# Patient Record
Sex: Male | Born: 1975 | Race: White | Hispanic: No | Marital: Married | Smoking: Never smoker
Health system: Southern US, Community
[De-identification: ages and names within clinical notes are randomized; demographics above are authoritative.]

## PROBLEM LIST (undated history)

## (undated) DIAGNOSIS — J302 Other seasonal allergic rhinitis: Secondary | ICD-10-CM

## (undated) DIAGNOSIS — G4730 Sleep apnea, unspecified: Secondary | ICD-10-CM

## (undated) DIAGNOSIS — E7800 Pure hypercholesterolemia, unspecified: Secondary | ICD-10-CM

## (undated) HISTORY — DX: Other seasonal allergic rhinitis: J30.2

## (undated) HISTORY — DX: Sleep apnea, unspecified: G47.30

---

## 2014-10-27 ENCOUNTER — Emergency Department (HOSPITAL_COMMUNITY): Payer: 59

## 2014-10-27 ENCOUNTER — Emergency Department (HOSPITAL_COMMUNITY)
Admission: EM | Admit: 2014-10-27 | Discharge: 2014-10-27 | Disposition: A | Discharge status: Home / Self Care | Payer: 59 | Attending: Emergency Medicine | Admitting: Emergency Medicine

## 2014-10-27 ENCOUNTER — Encounter (HOSPITAL_COMMUNITY): Payer: Self-pay | Admitting: Emergency Medicine

## 2014-10-27 DIAGNOSIS — R110 Nausea: Secondary | ICD-10-CM | POA: Diagnosis not present

## 2014-10-27 DIAGNOSIS — Z7952 Long term (current) use of systemic steroids: Secondary | ICD-10-CM | POA: Diagnosis not present

## 2014-10-27 DIAGNOSIS — R0602 Shortness of breath: Secondary | ICD-10-CM | POA: Insufficient documentation

## 2014-10-27 DIAGNOSIS — E780 Pure hypercholesterolemia: Secondary | ICD-10-CM | POA: Insufficient documentation

## 2014-10-27 DIAGNOSIS — Z791 Long term (current) use of non-steroidal anti-inflammatories (NSAID): Secondary | ICD-10-CM | POA: Diagnosis not present

## 2014-10-27 DIAGNOSIS — R079 Chest pain, unspecified: Secondary | ICD-10-CM | POA: Diagnosis not present

## 2014-10-27 HISTORY — DX: Pure hypercholesterolemia, unspecified: E78.00

## 2014-10-27 LAB — CBC WITH DIFFERENTIAL/PLATELET
BASOS PCT: 1 % (ref 0–1)
Basophils Absolute: 0.1 10*3/uL (ref 0.0–0.1)
Eosinophils Absolute: 0.4 10*3/uL (ref 0.0–0.7)
Eosinophils Relative: 6 % — ABNORMAL HIGH (ref 0–5)
HCT: 48.5 % (ref 39.0–52.0)
Hemoglobin: 16.6 g/dL (ref 13.0–17.0)
LYMPHS ABS: 2.4 10*3/uL (ref 0.7–4.0)
Lymphocytes Relative: 33 % (ref 12–46)
MCH: 31.9 pg (ref 26.0–34.0)
MCHC: 34.2 g/dL (ref 30.0–36.0)
MCV: 93.1 fL (ref 78.0–100.0)
Monocytes Absolute: 0.5 10*3/uL (ref 0.1–1.0)
Monocytes Relative: 7 % (ref 3–12)
NEUTROS PCT: 53 % (ref 43–77)
Neutro Abs: 3.9 10*3/uL (ref 1.7–7.7)
PLATELETS: 333 10*3/uL (ref 150–400)
RBC: 5.21 MIL/uL (ref 4.22–5.81)
RDW: 12.9 % (ref 11.5–15.5)
WBC: 7.4 10*3/uL (ref 4.0–10.5)

## 2014-10-27 LAB — BASIC METABOLIC PANEL
Anion gap: 11 (ref 5–15)
BUN: 14 mg/dL (ref 6–23)
CO2: 24 mmol/L (ref 19–32)
CREATININE: 0.97 mg/dL (ref 0.50–1.35)
Calcium: 9.6 mg/dL (ref 8.4–10.5)
Chloride: 105 mmol/L (ref 96–112)
GFR calc Af Amer: >90 mL/min (ref >90)
GLUCOSE: 106 mg/dL — AB (ref 70–99)
POTASSIUM: 4.4 mmol/L (ref 3.5–5.1)
Sodium: 140 mmol/L (ref 135–145)

## 2014-10-27 LAB — I-STAT TROPONIN, ED
TROPONIN I, POC: 0 ng/mL (ref 0.00–0.08)
TROPONIN I, POC: 0 ng/mL (ref 0.00–0.08)
Troponin i, poc: 0.01 ng/mL (ref 0.00–0.08)

## 2014-10-27 LAB — D-DIMER, QUANTITATIVE (NOT AT ARMC): D DIMER QUANT: 4.3 ug{FEU}/mL — AB (ref 0.00–0.48)

## 2014-10-27 MED ORDER — KETOROLAC TROMETHAMINE 60 MG/2ML IM SOLN: 60.0000 mg | Freq: Once | INTRAMUSCULAR | Status: DC

## 2014-10-27 MED ORDER — KETOROLAC TROMETHAMINE 30 MG/ML IJ SOLN
30.0000 mg | Freq: Once | INTRAMUSCULAR | Status: AC
  Administered 2014-10-27: 30 mg via INTRAVENOUS
  Filled 2014-10-27: qty 1

## 2014-10-27 MED ORDER — IOHEXOL 350 MG/ML SOLN
80.0000 mL | Freq: Once | INTRAVENOUS | Status: AC | PRN
  Administered 2014-10-27: 80 mL via INTRAVENOUS

## 2014-10-27 MED ORDER — NAPROXEN 500 MG PO TABS: 500.0000 mg | ORAL_TABLET | Freq: Two times a day (BID) | ORAL | Status: DC

## 2014-10-27 MED ORDER — ASPIRIN 325 MG PO TABS
325.0000 mg | ORAL_TABLET | Freq: Once | ORAL | Status: AC
  Administered 2014-10-27: 325 mg via ORAL
  Filled 2014-10-27: qty 1

## 2014-10-27 NOTE — ED Notes (Signed)
I-stat drawn by phlebotomy.

## 2014-10-27 NOTE — ED Notes (Signed)
Pt is stable upon d/c and ambulates independently from ED with staff. Pt verbalizes understanding rt d/c instructions and medications.

## 2014-10-27 NOTE — ED Provider Notes (Signed)
CSN: [id number]     Arrival date & time 10/27/14  1059 History   First MD Initiated Contact with Patient 10/27/14 1213     Chief Complaint  Patient presents with  . Chest Pain     (Consider location/radiation/quality/duration/timing/severity/associated sxs/prior Treatment) Patient is a 39 y.o. male presenting with chest pain. The history is provided by the patient. No language interpreter was used.  Chest Pain Pain location:  Substernal area and R chest Pain quality: sharp   Pain radiates to:  Does not radiate Pain radiates to the back: no   Pain severity:  Severe Onset quality:  Sudden Duration:  3 hours Timing:  Constant Progression:  Improving Chronicity:  New Context: at rest   Relieved by:  Nothing Worsened by:  Nothing tried Ineffective treatments:  None tried Associated symptoms: diaphoresis, nausea and shortness of breath   Associated symptoms: no abdominal pain, no back pain, no cough, no dizziness, no dysphagia, no fatigue, no fever, no headache, no numbness, not vomiting and no weakness   Risk factors: male sex   Risk factors: no aortic disease, no birth control, no coronary artery disease, no diabetes mellitus, no Ehlers-Danlos syndrome, no high cholesterol, no hypertension, no immobilization, no Marfan's syndrome, not obese, not pregnant, no prior DVT/PE, no smoking and no surgery     Past Medical History  Diagnosis Date  . Hypercholesteremia    History reviewed. No pertinent past surgical history. History reviewed. No pertinent family history. History  Substance Use Topics  . Smoking status: Never Smoker   . Smokeless tobacco: Not on file  . Alcohol Use: Yes     Comment: occ    Review of Systems  Constitutional: Positive for diaphoresis. Negative for fever, activity change, appetite change and fatigue.  HENT: Negative for congestion, facial swelling, rhinorrhea and trouble swallowing.   Eyes: Negative for photophobia and pain.  Respiratory: Positive for  shortness of breath. Negative for cough and chest tightness.   Cardiovascular: Positive for chest pain. Negative for leg swelling.  Gastrointestinal: Positive for nausea. Negative for vomiting, abdominal pain, diarrhea and constipation.  Endocrine: Negative for polydipsia and polyuria.  Genitourinary: Negative for dysuria, urgency, decreased urine volume and difficulty urinating.  Musculoskeletal: Negative for back pain and gait problem.  Skin: Negative for color change, rash and wound.  Allergic/Immunologic: Negative for immunocompromised state.  Neurological: Negative for dizziness, facial asymmetry, speech difficulty, weakness, numbness and headaches.  Psychiatric/Behavioral: Negative for confusion, decreased concentration and agitation.      Allergies  Review of patient's allergies indicates no known allergies.  Home Medications   Prior to Admission medications   Medication Sig Start Date End Date Taking? Authorizing Provider  Ascorbic Acid (VITAMIN C PO) Take 1 tablet by mouth daily.   Yes Historical Provider, MD  atorvastatin (LIPITOR) 40 MG tablet Take 40 mg by mouth every evening.  10/14/14  Yes Historical Provider, MD  fluticasone (FLONASE) 50 MCG/ACT nasal spray Place 2 sprays into both nostrils daily as needed. For congestion 09/26/14  Yes Historical Provider, MD  naproxen (NAPROSYN) 500 MG tablet Take 1 tablet (500 mg total) by mouth 2 (two) times daily. 10/27/14   Toy Cookey, MD   BP 131/89 mmHg  Pulse 91  Temp(Src) 98.3 F (36.8 C) (Oral)  Resp 18  SpO2 96% Physical Exam  Constitutional: He is oriented to person, place, and time. He appears well-developed and well-nourished. No distress.  HENT:  Head: Normocephalic and atraumatic.  Mouth/Throat: No oropharyngeal exudate.  Eyes:  Pupils are equal, round, and reactive to light.  Neck: Normal range of motion. Neck supple.  Cardiovascular: Normal rate, regular rhythm and normal heart sounds.  Exam reveals no gallop  and no friction rub.   No murmur heard. Pulmonary/Chest: Effort normal and breath sounds normal. No respiratory distress. He has no wheezes. He has no rales.  Abdominal: Soft. Bowel sounds are normal. He exhibits no distension and no mass. There is no tenderness. There is no rebound and no guarding.  Musculoskeletal: Normal range of motion. He exhibits no edema or tenderness.  Neurological: He is alert and oriented to person, place, and time.  Skin: Skin is warm and dry.  Psychiatric: He has a normal mood and affect.    ED Course  Procedures (including critical care time) Labs Review Labs Reviewed  BASIC METABOLIC PANEL - Abnormal; Notable for the following:    Glucose, Bld 106 (*)    All other components within normal limits  CBC WITH DIFFERENTIAL/PLATELET - Abnormal; Notable for the following:    Eosinophils Relative 6 (*)    All other components within normal limits  D-DIMER, QUANTITATIVE - Abnormal; Notable for the following:    D-Dimer, Quant 4.30 (*)    All other components within normal limits  I-STAT TROPOININ, ED  Rosezena Sensor, ED  Rosezena Sensor, ED    Imaging Review Dg Chest 2 View  10/27/2014   CLINICAL DATA:  Chest pain.  EXAM: CHEST  2 VIEW  COMPARISON:  11/04/2007.  FINDINGS: Mediastinum hilar structures are normal. Lungs are clear. Heart size normal. No pleural effusion or pneumothorax.  IMPRESSION: No acute cardiopulmonary disease.   Electronically Signed   By: Maisie Fus  Register   On: 10/27/2014 13:02   Ct Angio Chest Pe W/cm &/or Wo Cm  10/27/2014   CLINICAL DATA:  Sudden onset sharp right sided CP worse with inspiration and cough  EXAM: CT ANGIOGRAPHY CHEST WITH CONTRAST  TECHNIQUE: Multidetector CT imaging of the chest was performed using the standard protocol during bolus administration of intravenous contrast. Multiplanar CT image reconstructions and MIPs were obtained to evaluate the vascular anatomy.  CONTRAST:  80mL OMNIPAQUE IOHEXOL 350 MG/ML SOLN   COMPARISON:  Current chest radiograph.  FINDINGS: There is no evidence of a pulmonary embolus.  Heart, great vessels, mediastinum and hila are unremarkable. No neck base or axillary masses or adenopathy.  Mild hazy and reticular opacity in the dependent lower lobes consistent with atelectasis. No evidence of pneumonia or edema. No pleural effusion or pneumothorax.  Limited evaluation of the upper abdomen is unremarkable.  No significant bony abnormality.  Review of the MIP images confirms the above findings.  IMPRESSION: 1. No evidence of a pulmonary embolus. 2. No acute findings. No findings to explain this patient's sudden onset right-sided chest pain. 3. Mild dependent subsegmental atelectasis in the posterior lower lobes. No evidence of pneumonia or pulmonary edema.   Electronically Signed   By: Amie Portland M.D.   On: 10/27/2014 15:40     EKG Interpretation   Date/Time:  Thursday October 27 2014 11:04:06 EST Ventricular Rate:  99 PR Interval:  154 QRS Duration: 86 QT Interval:  344 QTC Calculation: 441 R Axis:   64 Text Interpretation:  Normal sinus rhythm Cannot rule out Inferior infarct  , age undetermined Abnormal ECG S1, Q3, T3 Nonspecific T wave abnormality  No prior for comparison Confirmed by DOCHERTY  MD, MEGAN [phone]) on  10/27/2014 12:39:38 PM      MDM  Final diagnoses:  Chest pain    Pt is a 39 y.o. male with Pmhx as above who presents with sudden onset sharp R sided CP, worse with inspiration/cough, with assoc SOB, diaphoresis, nausea. Pain was 10/10, now 5/10 and is currently R lateral. On PE, VSS, pt in NAD. Lungs clear, no reproducible chest wall pain, skin changes.  EKG normal sinus rhythm with small inferior Q waves and nonspecific T wave abnormalities.  EKG does have S1 Q3, T3 appearance. NO hx or risk factors for PE/DVT, thought does report father had hx of blood clots.   D dimer elevated, but CTA nml. Repeat troponin ordered and was negative, reassuring given 6  hr time window from symptoms onset w/ constant pain. CP currently 1/10.  ACS, PE, dissection unlikely. Will d/c home with naproxen for pain, instructions for comm health & wellness f/u.    Memory [facility] evaluation in the Emergency Department is complete. It has been determined that no acute conditions requiring further emergency intervention are present at this time. The patient/guardian have been advised of the diagnosis and plan. We have discussed signs and symptoms that warrant return to the ED, such as changes or worsening in symptoms, worsneing pain, SOB, leg swelling, fever.       Toy CookeyMegan Docherty, MD 10/27/14 [phone]

## 2014-10-27 NOTE — ED Notes (Signed)
Pt states he was at work and began feeling like a band was squeezing around his chest. Pt became diaphoretic and had SOB. Pt states he sat down and that helped to alleviate some discomfort, but then he stood back up and the pain worsened. Pt states pain has now eased off, but it hurts when he breathes.

## 2014-10-27 NOTE — ED Notes (Signed)
Pt c/o right sided CP that was sharp in nature one hour ago worse with inspiration and cough; pt sts some improvement at present

## 2014-10-27 NOTE — Discharge Instructions (Signed)

## 2014-11-07 ENCOUNTER — Other Ambulatory Visit: Payer: Self-pay

## 2014-11-09 ENCOUNTER — Ambulatory Visit (INDEPENDENT_AMBULATORY_CARE_PROVIDER_SITE_OTHER): Payer: 59 | Admitting: Internal Medicine

## 2014-11-09 ENCOUNTER — Encounter: Payer: Self-pay | Admitting: Internal Medicine

## 2014-11-09 ENCOUNTER — Other Ambulatory Visit: Payer: Self-pay

## 2014-11-09 VITALS — BP 128/76 | HR 85 | Ht 79.0 in | Wt 268.0 lb

## 2014-11-09 DIAGNOSIS — R072 Precordial pain: Secondary | ICD-10-CM

## 2014-11-09 DIAGNOSIS — R079 Chest pain, unspecified: Secondary | ICD-10-CM

## 2014-11-09 DIAGNOSIS — E785 Hyperlipidemia, unspecified: Secondary | ICD-10-CM

## 2014-11-09 NOTE — Patient Instructions (Signed)
Your physician recommends that you schedule a follow-up appointment as needed  Your physician has requested that you have an echocardiogram. Echocardiography is a painless test that uses sound waves to create images of your heart. It provides your doctor with information about the size and shape of your heart and how well your heart's chambers and valves are working. This procedure takes approximately one hour. There are no restrictions for this procedure.   Your physician has requested that you have an exercise tolerance test. For further information please visit [email or web address]. Please also follow instruction sheet, as given.    

## 2014-11-09 NOTE — Progress Notes (Signed)
Cardiology Office Note   Date:  11/09/2014   ID:  Magic Mohler, DOB 1975/11/07, MRN [id number]  PCP:   Dr Cyndia Bent  Cardiologist:   Hillis Range, MD    Chief Complaint  Patient presents with  . Appointment    CP     History of Present Illness: Johnathan Thompson is a 39 y.o. male who presents today for cardiology evaluation.  He reports being in good health until 2/11 when he developed abrupt onset of 10/10 chest pain.  His pain occurred at rest and while standing.  He describes this as a squeezing sensation (not sharp) but worse with deep inspiration.  He had to sit down because of his discomfort.  He presented to [facility] where his pain gradually resolved.  CMs were negative and ekg revealed inferior TWI.  He had a positive Ddimer and therefore underwent chest CT which did not reveal PTE.  I have personally reviewed his CXR and CT films today. He has done well since that time, without any further chest discomfort.  Today, he denies symptoms of palpitations,  shortness of breath, orthopnea, PND, lower extremity edema, claudication, dizziness, presyncope, syncope, bleeding, or neurologic sequela. The patient is tolerating medications without difficulties and is otherwise without complaint today.    Past Medical History  Diagnosis Date  . Hypercholesteremia   . Seasonal allergies    No past surgical history on file.   Current Outpatient Prescriptions  Medication Sig Dispense Refill  . Ascorbic Acid (VITAMIN C PO) Take 1 tablet by mouth daily.    Marland Kitchen atorvastatin (LIPITOR) 40 MG tablet Take 40 mg by mouth every evening.   0  . fluticasone (FLONASE) 50 MCG/ACT nasal spray Place 2 sprays into both nostrils daily as needed. For congestion  2   No current facility-administered medications for this visit.    Allergies:   Review of patient's allergies indicates no known allergies.   Social History:  The patient  reports that he has never smoked. He does not have any smokeless tobacco  history on file. He reports that he drinks alcohol. He reports that he does not use illicit drugs.   Family History:  The patient's  family history includes CAD in his father; Diabetes in his father; Lung cancer in his mother; Peripheral vascular disease in his father.  He does not have siblings.    ROS:  Please see the history of present illness.   All other systems are reviewed and negative.    PHYSICAL EXAM: VS:  BP 128/76 mmHg  Pulse 85  Ht  (2.007 m)  Wt 268 lb (121.564 kg)  BMI 30.18 kg/m2 , BMI Body mass index is 30.18 kg/(m^2). GEN: Well nourished, well developed, in no acute distress HEENT: normal Neck: no JVD, carotid bruits, or masses Cardiac: RRR; no murmurs, rubs, or gallops,no edema  Respiratory:  clear to auscultation bilaterally, normal work of breathing GI: soft, nontender, nondistended, + BS MS: no deformity or atrophy Skin: warm and dry  Neuro:  Strength and sensation are intact Psych: euthymic mood, full affect  Recent Labs: 10/27/2014: BUN 14; Creatinine 0.97; Hemoglobin 16.6; Platelets 333; Potassium 4.4; Sodium 140    Lipid Panel  No results found for: CHOL, TRIG, HDL, CHOLHDL, VLDL, LDLCALC, LDLDIRECT   Wt Readings from Last 3 Encounters:  11/09/14 268 lb (121.564 kg)      Other studies Reviewed: Additional studies/ records that were reviewed today include: ED MD notes    ASSESSMENT AND PLAN:  1.  Chest pain Likely atypical however given severity, further CV workup is warranted. I will start on asa 81mg  daily today Proceed with echo and GXT for further CV risk stratification.  If these are normal, no further workup is planned  2. HL He says lipids have been recently checked and were "normal" I have asked that he obtain values and forward to my office    Labs/ tests ordered today include:  Orders Placed This Encounter  Procedures  . Exercise tolerance test  . 2D Echocardiogram without contrast    Follow-up:  If above testing is  normal, I will see as needed.  If testing is abnormal then we will proceed with additional risk stratification.  Randolm IdolSigned, Berdell Hostetler, MD  11/09/2014 10:49 PM     [facility] [address] [place] [address] [phone] (office) [phone] (fax)

## 2014-11-14 ENCOUNTER — Encounter: Payer: 59 | Admitting: Cardiovascular Disease

## 2014-11-29 ENCOUNTER — Telehealth (HOSPITAL_COMMUNITY): Payer: Self-pay

## 2014-11-29 NOTE — Telephone Encounter (Signed)
Encounter complete. 

## 2014-12-01 ENCOUNTER — Ambulatory Visit (HOSPITAL_COMMUNITY)
Admission: RE | Admit: 2014-12-01 | Discharge: 2014-12-01 | Disposition: A | Discharge status: Home / Self Care | Payer: 59 | Source: Ambulatory Visit | Attending: Internal Medicine | Admitting: Internal Medicine

## 2014-12-01 DIAGNOSIS — R079 Chest pain, unspecified: Secondary | ICD-10-CM | POA: Diagnosis not present

## 2014-12-01 DIAGNOSIS — E785 Hyperlipidemia, unspecified: Secondary | ICD-10-CM | POA: Insufficient documentation

## 2014-12-01 NOTE — Procedures (Signed)
Exercise Treadmill Test  Pre-Exercise Testing Evaluation Rhythm: normal sinus  Rate: 84    Test  Exercise Tolerance Test Ordering MD: Hillis RangeJames Allred, MD  Interpreting MD:   Unique Test No:1 Treadmill:  1  Indication for ETT: chest pain - rule out ischemia  Contraindication to ETT: No   Stress Modality: exercise - treadmill  Cardiac Imaging Performed: non   Protocol: standard Bruce - maximal  Max BP:  150/78  Max MPHR (bpm):  180 85% MPR (bpm):  153  MPHR obtained (bpm):  193 % MPHR obtained:  107  Reached 85% MPHR (min:sec):  9:40 Total Exercise Time (min-sec):  14:00  Workload in METS:  17.20 Borg Scale: 14  Reason ETT Terminated:  fatigue    ST Segment Analysis At Rest: normal ST segments - no evidence of significant ST depression With Exercise: no evidence of significant ST depression  Other Information Arrhythmia:  No Angina during ETT:  absent (0) Quality of ETT:  diagnostic  ETT Interpretation:  normal - no evidence of ischemia by ST analysis  Comments:  Excellent exercise tolerance with no CP symptoms  No ECG evidence of ischemia or arrhyhtmia  Duke TM Score 14 - LOW RISK  Recommendations: Consider non-cardiac etiology for CP.   Marykay LexHARDING, Kevante Lunt W, M.D., M.S. Interventional Cardiologist   Pager # [phone]

## 2014-12-01 NOTE — Progress Notes (Signed)
2D Echo Performed 12/01/2014    Lindzie Boxx, RCS  

## 2019-05-21 ENCOUNTER — Other Ambulatory Visit: Payer: Self-pay | Admitting: Orthopedic Surgery

## 2019-05-21 DIAGNOSIS — M542 Cervicalgia: Secondary | ICD-10-CM

## 2019-05-31 ENCOUNTER — Other Ambulatory Visit: Payer: Self-pay

## 2019-05-31 ENCOUNTER — Ambulatory Visit
Admission: RE | Admit: 2019-05-31 | Discharge: 2019-05-31 | Disposition: A | Discharge status: Home / Self Care | Payer: 59 | Source: Ambulatory Visit | Attending: Orthopedic Surgery | Admitting: Orthopedic Surgery

## 2019-05-31 DIAGNOSIS — M542 Cervicalgia: Secondary | ICD-10-CM

## 2019-05-31 MED ORDER — TRIAMCINOLONE ACETONIDE 40 MG/ML IJ SUSP (RADIOLOGY)
60.0000 mg | Freq: Once | INTRAMUSCULAR | Status: AC
  Administered 2019-05-31: 60 mg via EPIDURAL

## 2019-05-31 MED ORDER — IOPAMIDOL (ISOVUE-M 300) INJECTION 61%
1.0000 mL | Freq: Once | INTRAMUSCULAR | Status: AC
  Administered 2019-05-31: 12:00:00 1 mL via EPIDURAL

## 2019-05-31 NOTE — Discharge Instructions (Signed)

## 2019-09-17 HISTORY — PX: CERVICAL SPINE SURGERY: SHX589

## 2020-06-30 IMAGING — XA DG INJECT/[PERSON_NAME] INC NEEDLE/CATH/PLC EPI/CERV/THOR W/IMG
2 series · 2 of 2 positions shown · non-contrast
Comparison: none

CLINICAL DATA: Spondylosis without myelopathy. Right shoulder and
arm pain.

[Series 1: ortho standard · 1 of 1 slices shown (1 of 2)]
[im 1/1]
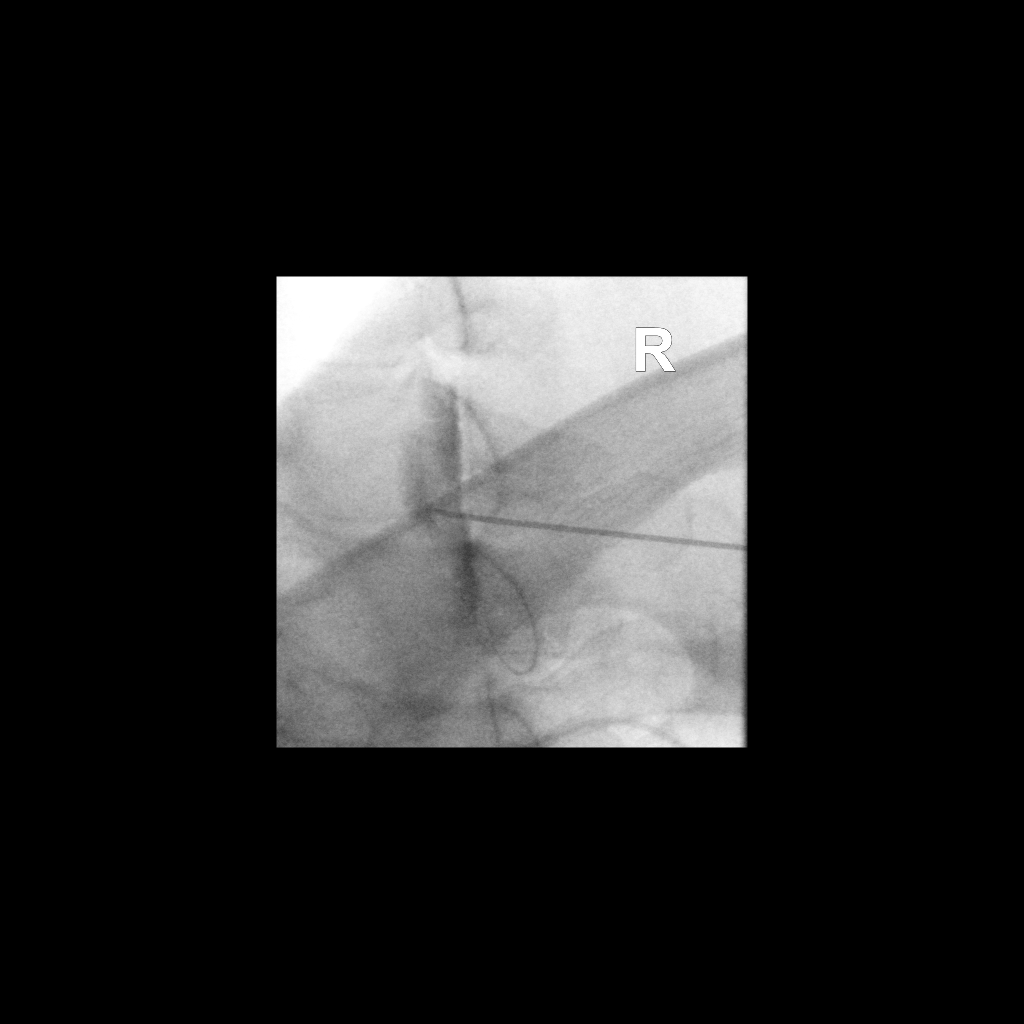

[Series 2: ortho standard · 1 of 1 slices shown (2 of 2)]
[im 1/1]
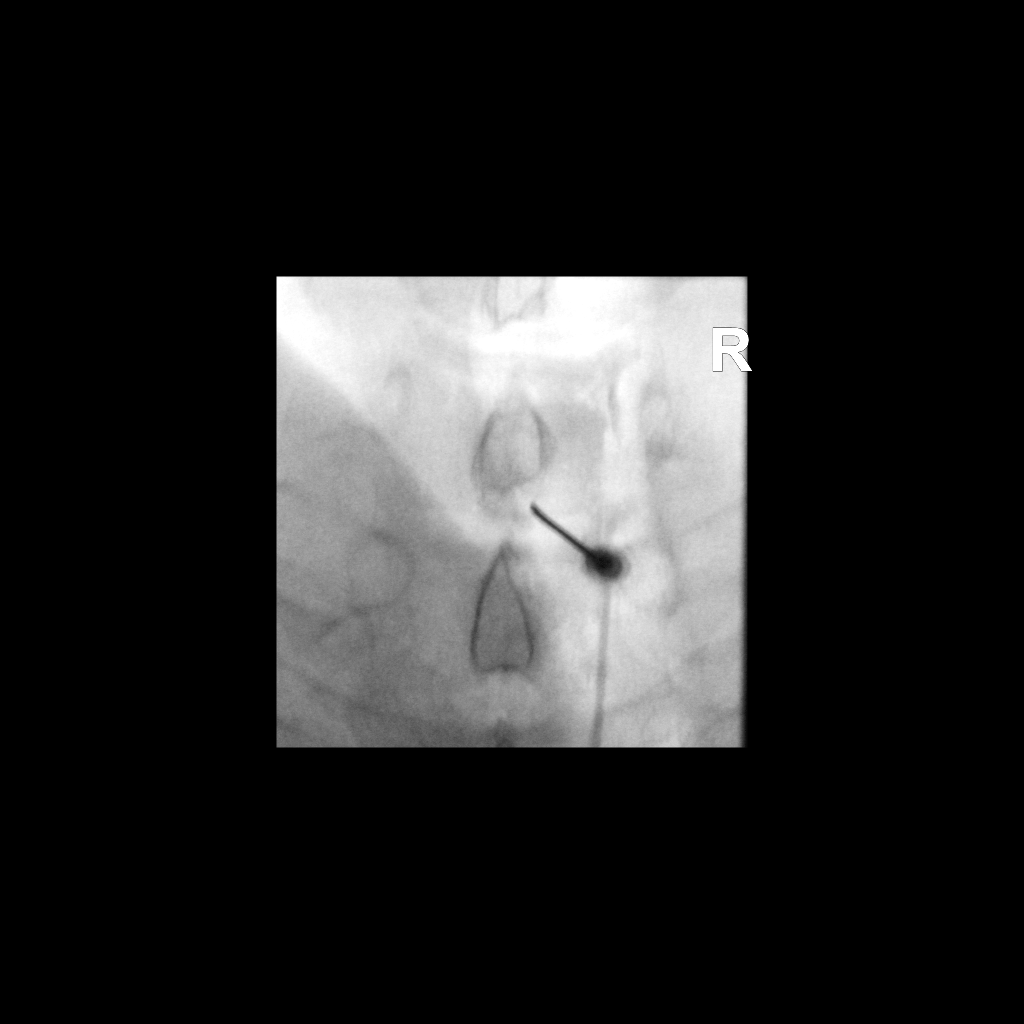

[2 of 2 positions shown; findings below may reference images not displayed]

FLUOROSCOPY TIME:  0 minutes 42 seconds. 21.82 micro gray meter
squared

PROCEDURE:
CERVICAL EPIDURAL INJECTION

An interlaminar approach was performed on the right at C7-T1. A 20
gauge epidural needle was advanced using loss-of-resistance
technique.

DIAGNOSTIC EPIDURAL INJECTION

Injection of Isovue-M 300 shows a good epidural pattern with spread
above and below the level of needle placement, primarily on the
right. No vascular opacification is seen. THERAPEUTIC

EPIDURAL INJECTION

1.5 ml of Kenalog 40 mixed with 1 ml of 1% Lidocaine and 2 ml of
normal saline were then instilled. The procedure was well-tolerated,
and the patient was discharged thirty minutes following the
injection in good condition.
IMPRESSION: Technically successful initial epidural injection on the right at
C7-T1.

## 2020-09-16 HISTORY — PX: ROTATOR CUFF REPAIR: SHX139

## 2023-12-03 ENCOUNTER — Encounter: Payer: Self-pay | Admitting: Medical

## 2023-12-03 ENCOUNTER — Ambulatory Visit: Payer: 59 | Admitting: Medical

## 2023-12-03 VITALS — BP 116/76 | HR 90 | Resp 18 | Ht 79.0 in | Wt 284.6 lb

## 2023-12-03 DIAGNOSIS — Z1211 Encounter for screening for malignant neoplasm of colon: Secondary | ICD-10-CM

## 2023-12-03 DIAGNOSIS — Z125 Encounter for screening for malignant neoplasm of prostate: Secondary | ICD-10-CM | POA: Diagnosis not present

## 2023-12-03 DIAGNOSIS — E785 Hyperlipidemia, unspecified: Secondary | ICD-10-CM | POA: Diagnosis not present

## 2023-12-03 DIAGNOSIS — J309 Allergic rhinitis, unspecified: Secondary | ICD-10-CM

## 2023-12-03 DIAGNOSIS — Z0000 Encounter for general adult medical examination without abnormal findings: Secondary | ICD-10-CM

## 2023-12-03 DIAGNOSIS — G4730 Sleep apnea, unspecified: Secondary | ICD-10-CM

## 2023-12-03 LAB — CBC WITH DIFFERENTIAL/PLATELET
Basophils Absolute: 0.1 10*3/uL (ref 0.0–0.1)
Basophils Relative: 1.7 % (ref 0.0–3.0)
Eosinophils Absolute: 0.5 10*3/uL (ref 0.0–0.7)
Eosinophils Relative: 7.7 % — ABNORMAL HIGH (ref 0.0–5.0)
HCT: 48.6 % (ref 39.0–52.0)
Hemoglobin: 16.2 g/dL (ref 13.0–17.0)
Lymphocytes Relative: 31.2 % (ref 12.0–46.0)
Lymphs Abs: 1.9 10*3/uL (ref 0.7–4.0)
MCHC: 33.4 g/dL (ref 30.0–36.0)
MCV: 97 fl (ref 78.0–100.0)
Monocytes Absolute: 0.6 10*3/uL (ref 0.1–1.0)
Monocytes Relative: 9.8 % (ref 3.0–12.0)
Neutro Abs: 3 10*3/uL (ref 1.4–7.7)
Neutrophils Relative %: 49.6 % (ref 43.0–77.0)
Platelets: 286 10*3/uL (ref 150.0–400.0)
RBC: 5.01 Mil/uL (ref 4.22–5.81)
RDW: 13.5 % (ref 11.5–15.5)
WBC: 6.1 10*3/uL (ref 4.0–10.5)

## 2023-12-03 LAB — LIPID PANEL
Cholesterol: 174 mg/dL (ref 0–200)
HDL: 42.6 mg/dL (ref >39.00)
LDL Cholesterol: 107 mg/dL — ABNORMAL HIGH (ref 0–99)
NonHDL: 131.13
Total CHOL/HDL Ratio: 4
Triglycerides: 122 mg/dL (ref 0.0–149.0)
VLDL: 24.4 mg/dL (ref 0.0–40.0)

## 2023-12-03 LAB — COMPREHENSIVE METABOLIC PANEL
ALT: 54 U/L — ABNORMAL HIGH (ref 0–53)
AST: 36 U/L (ref 0–37)
Albumin: 4.9 g/dL (ref 3.5–5.2)
Alkaline Phosphatase: 93 U/L (ref 39–117)
BUN: 22 mg/dL (ref 6–23)
CO2: 28 meq/L (ref 19–32)
Calcium: 10 mg/dL (ref 8.4–10.5)
Chloride: 107 meq/L (ref 96–112)
Creatinine, Ser: 0.93 mg/dL (ref 0.40–1.50)
GFR: 96.18 mL/min (ref >60.00)
Glucose, Bld: 105 mg/dL — ABNORMAL HIGH (ref 70–99)
Potassium: 4.3 meq/L (ref 3.5–5.1)
Sodium: 142 meq/L (ref 135–145)
Total Bilirubin: 0.7 mg/dL (ref 0.2–1.2)
Total Protein: 7.4 g/dL (ref 6.0–8.3)

## 2023-12-03 LAB — PSA: PSA: 0.8 ng/mL (ref 0.10–4.00)

## 2023-12-03 NOTE — Progress Notes (Signed)
 Subjective:    Patient ID: Johnathan Thompson, male    DOB: 01-04-1976, 48 y.o.   MRN: [id number]  HPI  Pt in for first time.  Pt was with [facility]. 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 No regular exercise. State moderate healthy diet. Fruits, vegetables and avoid fried foods. Red meat 2-3 times a week. Nonsmoker. Alcohol- 2-4 days. Wine 2 glasses when he does drink. Sometimes beer.   On review pt never had colonoscopy.  Last tetanus date? Pt can't remember.  Will wait to get records to review before giving.   High cholesterol- on 40 mg daily. On for 25 years.Fh high cholesterol but no MI or strokes in family.  Sleep apnea- on cpap for 3 months.   Review of Systems  Constitutional:  Negative for chills, fatigue and fever.  HENT:  Negative for congestion, drooling and ear discharge.   Respiratory:  Negative for cough, chest tightness and wheezing.   Cardiovascular:  Negative for chest pain and palpitations.  Gastrointestinal:  Negative for abdominal pain, blood in stool and diarrhea.  Genitourinary:  Negative for dysuria and frequency.  Musculoskeletal:  Negative for back pain, joint swelling, neck pain and neck stiffness.  Skin:  Negative for pallor and rash.  Neurological:  Negative for seizures and facial asymmetry.  Hematological:  Negative for adenopathy. Does not bruise/bleed easily.  Psychiatric/Behavioral:  Negative for behavioral problems and dysphoric mood. The patient is not nervous/anxious.     Past Medical History:  Diagnosis Date   Hypercholesteremia    Seasonal allergies      Social History   Socioeconomic History   Marital status: Married    Spouse name: Not on file   Number of children: Not on file   Years of education: Not on file   Highest education level: Not on file  Occupational History   Not on file  Tobacco Use   Smoking status: Never   Smokeless tobacco: Not on file  Substance and Sexual Activity   Alcohol use: Yes     Comment: occ   Drug use: No   Sexual activity: Not on file  Other Topics Concern   Not on file  Social History Narrative   Pt lives in [place] with family.  Works for [employer] as [employer].   Social Drivers of Corporate investment banker Strain: Low Risk  (08/17/2023)   Received from [facility]   Overall Financial Resource Strain (CARDIA)    Difficulty of Paying Living Expenses: Not hard at all  Food Insecurity: No Food Insecurity (08/17/2023)   [facility]   Hunger Vital Sign    Worried About Running Out of Food in the Last Year: Never true    Ran Out of Food in the Last Year: Never true  Transportation Needs: No Transportation Needs (08/17/2023)   [facility] - Transportation    Lack of Transportation (Medical): No    Lack of Transportation (Non-Medical): No  Physical Activity: Sufficiently Active (08/17/2023)   Received from [facility]   Exercise Vital Sign    Days of Exercise per Week: 2 days    Minutes of Exercise per Session: 120 min  Stress: No Stress Concern Present (08/17/2023)   Received from [facility] - Occupational Stress Questionnaire    Feeling of Stress : Only a little  Social Connections: Socially Integrated (08/17/2023)   Received from [facility]  Social Network    How would you rate your social network (family, work, friends)?: Good participation with social networks  Intimate Partner Violence: Not At Risk (08/17/2023)   Received from [facility]   HITS    Over the last 12 months how often did your partner physically hurt you?: Never    Over the last 12 months how often did your partner insult you or talk down to you?: Never    Over the last 12 months how often did your partner threaten you with physical harm?: Never    Over the last 12 months how often did your partner scream or curse at you?: Never    No past surgical history on file.  Family History   Problem Relation Age of Onset   Lung cancer Mother        died at age 35 of lung ca (smoker)   CAD Father        prior stent   Peripheral vascular disease Father    Diabetes Father     No Known Allergies  Current Outpatient Medications on File Prior to Visit  Medication Sig Dispense Refill   atorvastatin (LIPITOR) 40 MG tablet Take 40 mg by mouth every evening.   0   fluticasone (FLONASE) 50 MCG/ACT nasal spray Place 2 sprays into both nostrils daily as needed. For congestion  2   No current facility-administered medications on file prior to visit.    BP 116/76   Pulse 90   Resp 18   Ht 6\' 7"  (2.007 m)   Wt 284 lb 9.6 oz (129.1 kg)   SpO2 94%   BMI 32.06 kg/m   95% on on recheck.     Objective:   Physical Exam  General Mental Status- Alert. General Appearance- Not in acute distress.   Skin No worrisome moles or lesions(inspection anterior and posterior thorax)  Neck  No JVD.  Chest and Lung Exam Auscultation: Breath Sounds:-Normal.  Cardiovascular Auscultation:Rythm- Regular. Murmurs & Other Heart Sounds:Auscultation of the heart reveals- No Murmurs.  Abdomen Inspection:-Inspeection Normal. Palpation/Percussion:Note:No mass. Palpation and Percussion of the abdomen reveal- Non Tender, Non Distended + BS, no rebound or guarding.   Neurologic Cranial Nerve exam:- CN III-XII intact(No nystagmus), symmetric smile. Strength:- 5/5 equal and symmetric strength both upper and lower extremities.       Assessment & Plan:   Patient Instructions  For you wellness exam today I have ordered cbc, cmp, psa and lipid panel.  Unclear if you need tdap. Need to review records or call prior office for date.  Recommend exercise and healthy diet.  We will let you know lab results as they come in.  Follow up date appointment will be determined after lab review.    Follow lipid panel and see if need med adjustment.  Continue cpap.     Esperanza Richters, PA-C

## 2023-12-03 NOTE — Patient Instructions (Addendum)
 For you wellness exam today I have ordered cbc, cmp, psa and lipid panel.  Unclear if you need tdap. Need to review records or call prior office for date.  Recommend exercise and healthy diet.  We will let you know lab results as they come in.  Follow up date appointment will be determined after lab review.    Follow lipid panel and see if need med adjustment.  Continue cpap for sleep apnea.  Keep up with skin cancer screening thru derm as they recommend. Today no worrisome moles or lesions.  Preventive Care 16-64 Years Old, Male Preventive care refers to lifestyle choices and visits with your health care provider that can promote health and wellness. Preventive care visits are also called wellness exams. What can I expect for my preventive care visit? Counseling During your preventive care visit, your health care provider may ask about your: Medical history, including: Past medical problems. Family medical history. Current health, including: Emotional well-being. Home life and relationship well-being. Sexual activity. Lifestyle, including: Alcohol, nicotine or tobacco, and drug use. Access to firearms. Diet, exercise, and sleep habits. Safety issues such as seatbelt and bike helmet use. Sunscreen use. Work and work Astronomer. Physical exam Your health care provider will check your: Height and weight. These may be used to calculate your BMI (body mass index). BMI is a measurement that tells if you are at a healthy weight. Waist circumference. This measures the distance around your waistline. This measurement also tells if you are at a healthy weight and may help predict your risk of certain diseases, such as type 2 diabetes and high blood pressure. Heart rate and blood pressure. Body temperature. Skin for abnormal spots. What immunizations do I need?  Vaccines are usually given at various ages, according to a schedule. Your health care provider will recommend vaccines  for you based on your age, medical history, and lifestyle or other factors, such as travel or where you work. What tests do I need? Screening Your health care provider may recommend screening tests for certain conditions. This may include: Lipid and cholesterol levels. Diabetes screening. This is done by checking your blood sugar (glucose) after you have not eaten for a while (fasting). Hepatitis B test. Hepatitis C test. HIV (human immunodeficiency virus) test. STI (sexually transmitted infection) testing, if you are at risk. Lung cancer screening. Prostate cancer screening. Colorectal cancer screening. Talk with your health care provider about your test results, treatment options, and if necessary, the need for more tests. Follow these instructions at home: Eating and drinking  Eat a diet that includes fresh fruits and vegetables, whole grains, lean protein, and low-fat dairy products. Take vitamin and mineral supplements as recommended by your health care provider. Do not drink alcohol if your health care provider tells you not to drink. If you drink alcohol: Limit how much you have to 0-2 drinks a day. Know how much alcohol is in your drink. In the [place] one drink equals one 12 oz bottle of beer (355 mL), one 5 oz glass of wine (148 mL), or one 1 oz glass of hard liquor (44 mL). Lifestyle Brush your teeth every morning and night with fluoride toothpaste. Floss one time each day. Exercise for at least 30 minutes 5 or more days each week. Do not use any products that contain nicotine or tobacco. These products include cigarettes, chewing tobacco, and vaping devices, such as e-cigarettes. If you need help quitting, ask your health care provider. Do not use drugs. If  you are sexually active, practice safe sex. Use a condom or other form of protection to prevent STIs. Take aspirin only as told by your health care provider. Make sure that you understand how much to take and what form to  take. Work with your health care provider to find out whether it is safe and beneficial for you to take aspirin daily. Find healthy ways to manage stress, such as: Meditation, yoga, or listening to music. Journaling. Talking to a trusted person. Spending time with friends and family. Minimize exposure to UV radiation to reduce your risk of skin cancer. Safety Always wear your seat belt while driving or riding in a vehicle. Do not drive: If you have been drinking alcohol. Do not ride with someone who has been drinking. When you are tired or distracted. While texting. If you have been using any mind-altering substances or drugs. Wear a helmet and other protective equipment during sports activities. If you have firearms in your house, make sure you follow all gun safety procedures. What's next? Go to your health care provider once a year for an annual wellness visit. Ask your health care provider how often you should have your eyes and teeth checked. Stay up to date on all vaccines. This information is not intended to replace advice given to you by your health care provider. Make sure you discuss any questions you have with your health care provider. Document Revised: 02/28/2021 Document Reviewed: 02/28/2021 Elsevier Patient Education  2024 [employer].

## 2023-12-04 ENCOUNTER — Ambulatory Visit

## 2023-12-04 ENCOUNTER — Encounter: Payer: Self-pay | Admitting: Medical

## 2023-12-04 ENCOUNTER — Other Ambulatory Visit: Payer: Self-pay | Admitting: *Deleted

## 2023-12-04 DIAGNOSIS — R739 Hyperglycemia, unspecified: Secondary | ICD-10-CM

## 2023-12-04 LAB — HEMOGLOBIN A1C: Hgb A1c MFr Bld: 5.7 % (ref 4.6–6.5)

## 2023-12-25 ENCOUNTER — Encounter: Payer: Self-pay | Admitting: Pediatrics

## 2023-12-31 ENCOUNTER — Telehealth: Admitting: Physician Assistant

## 2023-12-31 DIAGNOSIS — J019 Acute sinusitis, unspecified: Secondary | ICD-10-CM

## 2023-12-31 DIAGNOSIS — B9689 Other specified bacterial agents as the cause of diseases classified elsewhere: Secondary | ICD-10-CM

## 2023-12-31 MED ORDER — AMOXICILLIN-POT CLAVULANATE 875-125 MG PO TABS: 1.0000 | ORAL_TABLET | Freq: Two times a day (BID) | ORAL | 0 refills | Status: DC

## 2023-12-31 NOTE — Progress Notes (Signed)
 Virtual Visit Consent   Johnathan Thompson, you are scheduled for a virtual visit with a [facility] provider today. Just as with appointments in the office, your consent must be obtained to participate. Your consent will be active for this visit and any virtual visit you may have with one of our providers in the next 365 days. If you have a MyChart account, a copy of this consent can be sent to you electronically.  As this is a virtual visit, video technology does not allow for your provider to perform a traditional examination. This may limit your provider's ability to fully assess your condition. If your provider identifies any concerns that need to be evaluated in person or the need to arrange testing (such as labs, EKG, etc.), we will make arrangements to do so. Although advances in technology are sophisticated, we cannot ensure that it will always work on either your end or our end. If the connection with a video visit is poor, the visit may have to be switched to a telephone visit. With either a video or telephone visit, we are not always able to ensure that we have a secure connection.  By engaging in this virtual visit, you consent to the provision of healthcare and authorize for your insurance to be billed (if applicable) for the services provided during this visit. Depending on your insurance coverage, you may receive a charge related to this service.  I need to obtain your verbal consent now. Are you willing to proceed with your visit today? Johnathan Thompson has provided verbal consent on 12/31/2023 for a virtual visit (video or telephone). Johnathan Loveless, PA-C  Date: 12/31/2023 10:36 AM   Virtual Visit via Video Note   I, Johnathan Thompson, connected with  Johnathan Thompson  ([id number], 04/24/1976) on 12/31/23 at 10:30 AM EDT by a video-enabled telemedicine application and verified that I am speaking with the correct person using two identifiers.  Location: Patient: Virtual Visit  Location Patient: Other: work, isolated Provider: Engineer, mining Provider: Home Office   I discussed the limitations of evaluation and management by telemedicine and the availability of in person appointments. The patient expressed understanding and agreed to proceed.    History of Present Illness: Johnathan Thompson is a 48 y.o. who identifies as a male who was assigned male at birth, and is being seen today for sinus congestion.  HPI: Sinusitis This is a new problem. The current episode started 1 to 4 weeks ago. The problem has been gradually worsening since onset. There has been no fever. The pain is moderate. Associated symptoms include congestion, coughing (from drainage), ear pain (mild), headaches, sinus pressure and a sore throat (from post nasal drainage). Pertinent negatives include no chills, diaphoresis, hoarse voice, shortness of breath or sneezing. Treatments tried: flonase, allegra. The treatment provided no relief.      Problems:  Patient Active Problem List   Diagnosis Date Noted   Chest pain 11/09/2014   Hyperlipidemia 11/09/2014    Allergies: No Known Allergies Medications:  Current Outpatient Medications:    amoxicillin-clavulanate (AUGMENTIN) 875-125 MG tablet, Take 1 tablet by mouth 2 (two) times daily., Disp: 14 tablet, Rfl: 0   atorvastatin (LIPITOR) 40 MG tablet, Take 40 mg by mouth every evening. , Disp: , Rfl: 0   fluticasone (FLONASE) 50 MCG/ACT nasal spray, Place 2 sprays into both nostrils daily as needed. For congestion, Disp: , Rfl: 2  Observations/Objective: Patient is well-developed, well-nourished in no acute distress.  Resting comfortably at  home.  Head is normocephalic, atraumatic.  No labored breathing.  Speech is clear and coherent with logical content.  Patient is alert and oriented at baseline.    Assessment and Plan: 1. Acute bacterial sinusitis (Primary) - amoxicillin-clavulanate (AUGMENTIN) 875-125 MG tablet; Take 1 tablet by mouth  2 (two) times daily.  Dispense: 14 tablet; Refill: 0  - Worsening symptoms that have not responded to OTC medications.  - Will give Augmentin - Continue allergy medications.  - Steam and humidifier can help - Stay well hydrated and get plenty of rest.  - Seek in person evaluation if no symptom improvement or if symptoms worsen   Follow Up Instructions: I discussed the assessment and treatment plan with the patient. The patient was provided an opportunity to ask questions and all were answered. The patient agreed with the plan and demonstrated an understanding of the instructions.  A copy of instructions were sent to the patient via MyChart unless otherwise noted below.    The patient was advised to call back or seek an in-person evaluation if the symptoms worsen or if the condition fails to improve as anticipated.    Johnathan Kelp, PA-C

## 2023-12-31 NOTE — Patient Instructions (Signed)
 Johnathan Thompson, thank you for joining Angelia Kelp, PA-C for today's virtual visit.  While this provider is not your primary care provider (PCP), if your PCP is located in our provider database this encounter information will be shared with them immediately following your visit.   A [facility] MyChart account gives you access to today's visit and all your visits, tests, and labs performed at [facility] " click here if you don't have a [facility] MyChart account or go to [email or web address]  Consent: (Patient) Johnathan Thompson provided verbal consent for this virtual visit at the beginning of the encounter.  Current Medications:  Current Outpatient Medications:    amoxicillin-clavulanate (AUGMENTIN) 875-125 MG tablet, Take 1 tablet by mouth 2 (two) times daily., Disp: 14 tablet, Rfl: 0   atorvastatin (LIPITOR) 40 MG tablet, Take 40 mg by mouth every evening. , Disp: , Rfl: 0   fluticasone (FLONASE) 50 MCG/ACT nasal spray, Place 2 sprays into both nostrils daily as needed. For congestion, Disp: , Rfl: 2   Medications ordered in this encounter:  Meds ordered this encounter  Medications   amoxicillin-clavulanate (AUGMENTIN) 875-125 MG tablet    Sig: Take 1 tablet by mouth 2 (two) times daily.    Dispense:  14 tablet    Refill:  0    Supervising Provider:   Corine Dice [[id number]]     *If you need refills on other medications prior to your next appointment, please contact your pharmacy*  Follow-Up: Call back or seek an in-person evaluation if the symptoms worsen or if the condition fails to improve as anticipated.  [facility] [phone]  Other Instructions Sinus Infection, Adult A sinus infection, also called sinusitis, is inflammation of your sinuses. Sinuses are hollow spaces in the bones around your face. Your sinuses are located: Around your eyes. In the middle of your forehead. Behind your nose. In your cheekbones. Mucus  normally drains out of your sinuses. When your nasal tissues become inflamed or swollen, mucus can become trapped or blocked. This allows bacteria, viruses, and fungi to grow, which leads to infection. Most infections of the sinuses are caused by a virus. A sinus infection can develop quickly. It can last for up to 4 weeks (acute) or for more than 12 weeks (chronic). A sinus infection often develops after a cold. What are the causes? This condition is caused by anything that creates swelling in the sinuses or stops mucus from draining. This includes: Allergies. Asthma. Infection from bacteria or viruses. Deformities or blockages in your nose or sinuses. Abnormal growths in the nose (nasal polyps). Pollutants, such as chemicals or irritants in the air. Infection from fungi. This is rare. What increases the risk? You are more likely to develop this condition if you: Have a weak body defense system (immune system). Do a lot of swimming or diving. Overuse nasal sprays. Smoke. What are the signs or symptoms? The main symptoms of this condition are pain and a feeling of pressure around the affected sinuses. Other symptoms include: Stuffy nose or congestion that makes it difficult to breathe through your nose. Thick yellow or greenish drainage from your nose. Tenderness, swelling, and warmth over the affected sinuses. A cough that may get worse at night. Decreased sense of smell and taste. Extra mucus that collects in the throat or the back of the nose (postnasal drip) causing a sore throat or bad breath. Tiredness (fatigue). Fever. How is this diagnosed? This condition is diagnosed based on: Your  symptoms. Your medical history. A physical exam. Tests to find out if your condition is acute or chronic. This may include: Checking your nose for nasal polyps. Viewing your sinuses using a device that has a light (endoscope). Testing for allergies or bacteria. Imaging tests, such as an MRI or  CT scan. In rare cases, a bone biopsy may be done to rule out more serious types of fungal sinus disease. How is this treated? Treatment for a sinus infection depends on the cause and whether your condition is chronic or acute. If caused by a virus, your symptoms should go away on their own within 10 days. You may be given medicines to relieve symptoms. They include: Medicines that shrink swollen nasal passages (decongestants). A spray that eases inflammation of the nostrils (topical intranasal corticosteroids). Rinses that help get rid of thick mucus in your nose (nasal saline washes). Medicines that treat allergies (antihistamines). Over-the-counter pain relievers. If caused by bacteria, your health care provider may recommend waiting to see if your symptoms improve. Most bacterial infections will get better without antibiotic medicine. You may be given antibiotics if you have: A severe infection. A weak immune system. If caused by narrow nasal passages or nasal polyps, surgery may be needed. Follow these instructions at home: Medicines Take, use, or apply over-the-counter and prescription medicines only as told by your health care provider. These may include nasal sprays. If you were prescribed an antibiotic medicine, take it as told by your health care provider. Do not stop taking the antibiotic even if you start to feel better. Hydrate and humidify  Drink enough fluid to keep your urine pale yellow. Staying hydrated will help to thin your mucus. Use a cool mist humidifier to keep the humidity level in your home above 50%. Inhale steam for 10-15 minutes, 3-4 times a day, or as told by your health care provider. You can do this in the bathroom while a hot shower is running. Limit your exposure to cool or dry air. Rest Rest as much as possible. Sleep with your head raised (elevated). Make sure you get enough sleep each night. General instructions  Apply a warm, moist washcloth to your  face 3-4 times a day or as told by your health care provider. This will help with discomfort. Use nasal saline washes as often as told by your health care provider. Wash your hands often with soap and water to reduce your exposure to germs. If soap and water are not available, use hand sanitizer. Do not smoke. Avoid being around people who are smoking (secondhand smoke). Keep all follow-up visits. This is important. Contact a health care provider if: You have a fever. Your symptoms get worse. Your symptoms do not improve within 10 days. Get help right away if: You have a severe headache. You have persistent vomiting. You have severe pain or swelling around your face or eyes. You have vision problems. You develop confusion. Your neck is stiff. You have trouble breathing. These symptoms may be an emergency. Get help right away. Call 911. Do not wait to see if the symptoms will go away. Do not drive yourself to the hospital. Summary A sinus infection is soreness and inflammation of your sinuses. Sinuses are hollow spaces in the bones around your face. This condition is caused by nasal tissues that become inflamed or swollen. The swelling traps or blocks the flow of mucus. This allows bacteria, viruses, and fungi to grow, which leads to infection. If you were prescribed an antibiotic  medicine, take it as told by your health care provider. Do not stop taking the antibiotic even if you start to feel better. Keep all follow-up visits. This is important. This information is not intended to replace advice given to you by your health care provider. Make sure you discuss any questions you have with your health care provider. Document Revised: 08/07/2021 Document Reviewed: 08/07/2021 Elsevier Patient Education  2024 [employer]   If you have been instructed to have an in-person evaluation today at a local Urgent Care facility, please use the link below. It will take you to a list of all of our  available [facility], including address, phone number and hours of operation. Please do not delay care.  [facility]  If you or a family member do not have a primary care provider, use the link below to schedule a visit and establish care. When you choose a [facility] primary care physician or advanced practice provider, you gain a long-term partner in health. Find a Primary Care Provider  Learn more about Center's in-office and virtual care options: [facility] - Get Care Now

## 2024-01-22 ENCOUNTER — Ambulatory Visit

## 2024-01-22 VITALS — Ht 79.0 in | Wt 280.0 lb

## 2024-01-22 DIAGNOSIS — Z1211 Encounter for screening for malignant neoplasm of colon: Secondary | ICD-10-CM

## 2024-01-22 MED ORDER — NA SULFATE-K SULFATE-MG SULF 17.5-3.13-1.6 GM/177ML PO SOLN: 1.0000 | Freq: Once | ORAL | 0 refills | Status: AC

## 2024-01-22 NOTE — Progress Notes (Signed)
 Pre visit completed via phone call; Patient verified name, DOB, and address; No egg or soy allergy known to patient;  No issues known to pt with past sedation with any surgeries or procedures; Patient denies ever being told they had issues or difficulty with intubation;  No FH of Malignant Hyperthermia; Pt is not on diet pills; Pt is not on home 02;  Pt is not on blood thinners;  Pt denies issues with constipation;  No A fib or A flutter; Have any cardiac testing pending--NO Insurance verified during PV appt--- UHC Pt can ambulate without assistance;  Pt denies use of chewing tobacco; Discussed diabetic/weight loss medication holds; Discussed NSAID holds; Checked BMI to be less than 50; Pt instructed to use Singlecare.com or GoodRx for a price reduction on prep;  Patient's chart reviewed by Rogena Class CNRA prior to previsit and patient appropriate for the [facility];  Pre visit completed and red dot placed by patient's name on their procedure day (on provider's schedule);   Instructions sent to MyChart and also printed and mailed to the patient per his request;

## 2024-01-27 ENCOUNTER — Encounter: Payer: Self-pay | Admitting: Gastroenterology

## 2024-02-05 ENCOUNTER — Encounter: Admitting: Pediatrics

## 2024-02-16 ENCOUNTER — Encounter: Admitting: Gastroenterology

## 2024-03-08 ENCOUNTER — Encounter: Payer: Self-pay | Admitting: Gastroenterology

## 2024-03-08 ENCOUNTER — Ambulatory Visit: Admitting: Gastroenterology

## 2024-03-08 VITALS — BP 116/78 | HR 76 | Temp 98.3°F | Resp 23 | Ht 79.0 in | Wt 280.0 lb

## 2024-03-08 DIAGNOSIS — K621 Rectal polyp: Secondary | ICD-10-CM | POA: Diagnosis not present

## 2024-03-08 DIAGNOSIS — K5730 Diverticulosis of large intestine without perforation or abscess without bleeding: Secondary | ICD-10-CM

## 2024-03-08 DIAGNOSIS — K635 Polyp of colon: Secondary | ICD-10-CM | POA: Diagnosis not present

## 2024-03-08 DIAGNOSIS — Z1211 Encounter for screening for malignant neoplasm of colon: Secondary | ICD-10-CM

## 2024-03-08 DIAGNOSIS — D125 Benign neoplasm of sigmoid colon: Secondary | ICD-10-CM

## 2024-03-08 DIAGNOSIS — D128 Benign neoplasm of rectum: Secondary | ICD-10-CM

## 2024-03-08 MED ORDER — SODIUM CHLORIDE 0.9 % IV SOLN: 500.0000 mL | Freq: Once | INTRAVENOUS | Status: DC

## 2024-03-08 NOTE — Progress Notes (Signed)
 GASTROENTEROLOGY PROCEDURE H&P NOTE   Primary Care Physician: Dorina Loving, PA-C    Reason for Procedure:  Colon Cancer screening  Plan:    Colonoscopy  Patient is appropriate for endoscopic procedure(s) in the ambulatory ([facility]) setting.  The nature of the procedure, as well as the risks, benefits, and alternatives were carefully and thoroughly reviewed with the patient. Ample time for discussion and questions allowed. The patient understood, was satisfied, and agreed to proceed.     HPI: Johnathan Thompson is a 48 y.o. male who presents for colonoscopy for routine Colon Cancer screening.  No active GI symptoms.  No known family history of colon cancer or related malignancy.  Patient is otherwise without complaints or active issues today.  Past Medical History:  Diagnosis Date   Hypercholesteremia    on meds   Seasonal allergies    Sleep apnea    CPAP    Past Surgical History:  Procedure Laterality Date   CERVICAL SPINE SURGERY  2021   ROTATOR CUFF REPAIR Left 2022    Prior to Admission medications   Medication Sig Start Date End Date Taking? Authorizing Provider  atorvastatin (LIPITOR) 80 MG tablet Take 80 mg by mouth daily. 12/01/23  Yes [provider]  loratadine (CLARITIN) 10 MG tablet Take 10 mg by mouth daily.   Yes [provider]  EPINEPHrine 0.3 mg/0.3 mL IJ SOAJ injection Inject 0.3 mg into the muscle as needed for anaphylaxis. Patient not taking: Reported on 01/22/2024 07/11/21   [provider]  fluticasone (FLONASE) 50 MCG/ACT nasal spray Place 2 sprays into both nostrils daily as needed. For congestion 09/26/14   [provider]  ibuprofen (ADVIL) 800 MG tablet Take 800 mg by mouth every 6 (six) hours as needed. 07/14/23   [provider]    Current Outpatient Medications  Medication Sig Dispense Refill   atorvastatin (LIPITOR) 80 MG tablet Take 80 mg by mouth daily.     loratadine (CLARITIN) 10 MG tablet Take  10 mg by mouth daily.     EPINEPHrine 0.3 mg/0.3 mL IJ SOAJ injection Inject 0.3 mg into the muscle as needed for anaphylaxis. (Patient not taking: Reported on 01/22/2024)     fluticasone (FLONASE) 50 MCG/ACT nasal spray Place 2 sprays into both nostrils daily as needed. For congestion  2   ibuprofen (ADVIL) 800 MG tablet Take 800 mg by mouth every 6 (six) hours as needed.     Current Facility-Administered Medications  Medication Dose Route Frequency Provider Last Rate Last Admin   0.9 %  sodium chloride infusion  500 mL Intravenous Once Ricky Gallery V, DO        Allergies as of 03/08/2024   (No Known Allergies)    Family History  Problem Relation Age of Onset   Lung cancer Mother        died at age 17 of lung ca (smoker)   CAD Father        prior stent   Peripheral vascular disease Father    Diabetes Father    Colon cancer Neg Hx    Esophageal cancer Neg Hx    Stomach cancer Neg Hx    Rectal cancer Neg Hx     Social History   Socioeconomic History   Marital status: Married    Spouse name: Not on file   Number of children: Not on file   Years of education: Not on file   Highest education level: Not on file  Occupational History  Not on file  Tobacco Use   Smoking status: Never   Smokeless tobacco: Never  Vaping Use   Vaping status: Never Used  Substance and Sexual Activity   Alcohol use: Yes    Alcohol/week: 4.0 standard drinks of alcohol    Types: 4 Standard drinks or equivalent per week    Comment: 2-4 days a week. 2 glass of wine at dinner.   Drug use: No   Sexual activity: Not on file  Other Topics Concern   Not on file  Social History Narrative   Pt lives in [place] with family.  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)   Social Drivers of Health   Financial Resource Strain: Low Risk  (08/17/2023)   Received from [facility]   Overall Financial Resource Strain (CARDIA)    Difficulty of Paying Living Expenses: Not hard at all  Food Insecurity: No  Food Insecurity (08/17/2023)   Received from [facility]   Hunger Vital Sign    Within the past 12 months, you worried that your food would run out before you got the money to buy more.: Never true    Within the past 12 months, the food you bought just didn't last and you didn't have money to get more.: Never true  Transportation Needs: No Transportation Needs (08/17/2023)   Received from [facility] - Transportation    Lack of Transportation (Medical): No    Lack of Transportation (Non-Medical): No  Physical Activity: Sufficiently Active (08/17/2023)   Received from [facility]   Exercise Vital Sign    On average, how many days per week do you engage in moderate to strenuous exercise (like a brisk walk)?: 2 days    On average, how many minutes do you engage in exercise at this level?: 120 min  Stress: No Stress Concern Present (08/17/2023)   Received from [facility] - Occupational Stress Questionnaire    Feeling of Stress : Only a little  Social Connections: Socially Integrated (08/17/2023)   Received from [facility]   Social Network    How would you rate your social network (family, work, friends)?: Good participation with social networks  Intimate Partner Violence: Not At Risk (08/17/2023)   Received from [facility]   HITS    Over the last 12 months how often did your partner physically hurt you?: Never    Over the last 12 months how often did your partner insult you or talk down to you?: Never    Over the last 12 months how often did your partner threaten you with physical harm?: Never    Over the last 12 months how often did your partner scream or curse at you?: Never    Physical Exam: Vital signs in last 24 hours: @BP  123/77   Pulse 79   Temp 98.3 F (36.8 C) (Temporal)   Ht 6' 7 (2.007 m)   Wt 280 lb (127 kg)   SpO2 98%   BMI 31.54 kg/m  GEN: NAD EYE: Sclerae anicteric ENT: MMM CV:  Non-tachycardic Pulm: CTA b/l GI: Soft, NT/ND NEURO:  Alert & Oriented x 3   Sandor Flatter, DO [facility]   03/08/2024 2:34 PM

## 2024-03-08 NOTE — Progress Notes (Signed)
 To pacu, VSS. Report to Rn.tb

## 2024-03-08 NOTE — Op Note (Signed)
 [facility] Patient Name: Johnathan Thompson Procedure Date: 03/08/2024 2:32 PM MRN: [id number] Endoscopist: Sandor Flatter , MD, [id number] Age: 48 Referring MD:  Date of Birth: 15-Sep-1976 Gender: Male Account #: [id number] Procedure:                Colonoscopy Indications:              Screening for colorectal malignant neoplasm, This                            is the patient's first colonoscopy Medicines:                Monitored Anesthesia Care Procedure:                Pre-Anesthesia Assessment:                           - Prior to the procedure, a History and Physical                            was performed, and patient medications and                            allergies were reviewed. The patient's tolerance of                            previous anesthesia was also reviewed. The risks                            and benefits of the procedure and the sedation                            options and risks were discussed with the patient.                            All questions were answered, and informed consent                            was obtained. Prior Anticoagulants: The patient has                            taken no anticoagulant or antiplatelet agents. ASA                            Grade Assessment: II - A patient with mild systemic                            disease. After reviewing the risks and benefits,                            the patient was deemed in satisfactory condition to                            undergo the procedure.  After obtaining informed consent, the colonoscope                            was passed under direct vision. Throughout the                            procedure, the patient's blood pressure, pulse, and                            oxygen saturations were monitored continuously. The                            Colonoscope was introduced through the anus and                            advanced to the the  cecum, identified by                            appendiceal orifice and ileocecal valve. The                            colonoscopy was technically difficult and complex                            due to significant looping. Successful completion                            of the procedure was aided by using manual pressure                            and straightening and shortening the scope to                            obtain bowel loop reduction. The patient tolerated                            the procedure well. The quality of the bowel                            preparation was good. The ileocecal valve,                            appendiceal orifice, and rectum were photographed. Scope In: 2:40:00 PM Scope Out: 3:02:32 PM Scope Withdrawal Time: 0 hours 13 minutes 48 seconds  Total Procedure Duration: 0 hours 22 minutes 32 seconds  Findings:                 The perianal and digital rectal examinations were                            normal.                           A 4 mm polyp was found in the sigmoid colon. The  polyp was sessile. The polyp was removed with a                            cold snare. Resection and retrieval were complete.                            Estimated blood loss was minimal.                           Three sessile polyps were found in the rectum. The                            polyps were 2 to 4 mm in size. These polyps were                            removed with a cold snare. Resection and retrieval                            were complete. Estimated blood loss was minimal.                           A few small-mouthed diverticula were found in the                            sigmoid colon and ascending colon.                           The retroflexed view of the distal rectum and anal                            verge was normal and showed no anal or rectal                            abnormalities. Complications:            No  immediate complications. Estimated Blood Loss:     Estimated blood loss was minimal. Impression:               - One 4 mm polyp in the sigmoid colon, removed with                            a cold snare. Resected and retrieved.                           - Three 2 to 4 mm polyps in the rectum, removed                            with a cold snare. Resected and retrieved.                           - Diverticulosis in the sigmoid colon and in the  ascending colon.                           - The distal rectum and anal verge are normal on                            retroflexion view. Recommendation:           - Patient has a contact number available for                            emergencies. The signs and symptoms of potential                            delayed complications were discussed with the                            patient. Return to normal activities tomorrow.                            Written discharge instructions were provided to the                            patient.                           - Resume previous diet.                           - Continue present medications.                           - Await pathology results.                           - Repeat colonoscopy for surveillance based on                            pathology results.                           - Return to GI office PRN. Sandor Flatter, MD 03/08/2024 3:11:35 PM

## 2024-03-08 NOTE — Patient Instructions (Signed)
 Resume previous diet Continue present medications Await pathology results Return to GI office as needed Repeat colonoscopy for surveillance based on pathology results    YOU HAD AN ENDOSCOPIC PROCEDURE TODAY AT THE [facility]:   Refer to the procedure report that was given to you for any specific questions about what was found during the examination.  If the procedure report does not answer your questions, please call your gastroenterologist to clarify.  If you requested that your care partner not be given the details of your procedure findings, then the procedure report has been included in a sealed envelope for you to review at your convenience later.  YOU SHOULD EXPECT: Some feelings of bloating in the abdomen. Passage of more gas than usual.  Walking can help get rid of the air that was put into your GI tract during the procedure and reduce the bloating. If you had a lower endoscopy (such as a colonoscopy or flexible sigmoidoscopy) you may notice spotting of blood in your stool or on the toilet paper. If you underwent a bowel prep for your procedure, you may not have a normal bowel movement for a few days.  Please Note:  You might notice some irritation and congestion in your nose or some drainage.  This is from the oxygen used during your procedure.  There is no need for concern and it should clear up in a day or so.  SYMPTOMS TO REPORT IMMEDIATELY:  Following lower endoscopy (colonoscopy or flexible sigmoidoscopy):  Excessive amounts of blood in the stool  Significant tenderness or worsening of abdominal pains  Swelling of the abdomen that is new, acute  Fever of 100F or higher   For urgent or emergent issues, a gastroenterologist can be reached at any hour by calling [phone]. Do not use MyChart messaging for urgent concerns.    DIET:  We do recommend a small meal at first, but then you may proceed to your regular diet.  Drink plenty of fluids but you should  avoid alcoholic beverages for 24 hours.  ACTIVITY:  You should plan to take it easy for the rest of today and you should NOT DRIVE or use heavy machinery until tomorrow (because of the sedation medicines used during the test).    FOLLOW UP: Our staff will call the number listed on your records the next business day following your procedure.  We will call around 7:15- 8:00 am to check on you and address any questions or concerns that you may have regarding the information given to you following your procedure. If we do not reach you, we will leave a message.     If any biopsies were taken you will be contacted by phone or by letter within the next 1-3 weeks.  Please call us  at [phone] if you have not heard about the biopsies in 3 weeks.    SIGNATURES/CONFIDENTIALITY: You and/or your care partner have signed paperwork which will be entered into your electronic medical record.  These signatures attest to the fact that that the information above on your After Visit Summary has been reviewed and is understood.  Full responsibility of the confidentiality of this discharge information lies with you and/or your care-partner.

## 2024-03-08 NOTE — Progress Notes (Signed)
 Pt's states no medical or surgical changes since previsit or office visit.

## 2024-03-09 ENCOUNTER — Telehealth: Payer: Self-pay | Admitting: *Deleted

## 2024-03-09 NOTE — Telephone Encounter (Signed)
 Left message on f/u call

## 2024-03-11 LAB — SURGICAL PATHOLOGY

## 2024-03-16 ENCOUNTER — Ambulatory Visit: Payer: Self-pay | Admitting: Gastroenterology

## 2024-04-30 ENCOUNTER — Telehealth: Payer: Self-pay

## 2024-04-30 NOTE — Telephone Encounter (Signed)
 Spoke with pt and gave him the number of where he completed the sleep study Notified him if he has any other concerns to please call us  back     Copied from CRM #[id number]. Topic: Clinical - Request for Lab/Test Order >> Apr 30, 2024  9:17 AM Mia F wrote: Reason for CRM: Trying to get a travel CPAP and need an order for it. Cannot remember the dr that wrote it in the past. Pt would like a call back   Please advise

## 2024-07-02 ENCOUNTER — Other Ambulatory Visit: Payer: Self-pay | Admitting: Medical

## 2024-07-02 MED ORDER — ATORVASTATIN CALCIUM 80 MG PO TABS: 80.0000 mg | ORAL_TABLET | Freq: Every day | ORAL | 1 refills | Status: AC

## 2024-07-02 NOTE — Telephone Encounter (Signed)
 Copied from CRM [id number]. Topic: Clinical - Medication Refill >> Jul 02, 2024  8:28 AM Berneda FALCON wrote: Medication: atorvastatin (LIPITOR) 80 MG tablet  90-day supply requested  Has the patient contacted their pharmacy? Yes (Agent: If no, request that the patient contact the pharmacy for the refill. If patient does not wish to contact the pharmacy document the reason why and proceed with request.) (Agent: If yes, when and what did the pharmacy advise?)  This is the patient's preferred pharmacy:  [id number] - [place], [place] - [address] [place] [address] Phone: [phone] Fax: [phone]  Is this the correct pharmacy for this prescription? Yes If no, delete pharmacy and type the correct one.   Has the prescription been filled recently? No  Is the patient out of the medication? Yes  Has the patient been seen for an appointment in the last year OR does the patient have an upcoming appointment? Yes  Can we respond through MyChart? Yes  Agent: Please be advised that Rx refills may take up to 3 business days. We ask that you follow-up with your pharmacy.

## 2024-08-20 ENCOUNTER — Ambulatory Visit: Payer: Self-pay

## 2024-08-20 ENCOUNTER — Ambulatory Visit: Admitting: Physician Assistant

## 2024-08-20 ENCOUNTER — Encounter: Payer: Self-pay | Admitting: Physician Assistant

## 2024-08-20 VITALS — BP 124/77 | HR 91 | Temp 98.2°F

## 2024-08-20 DIAGNOSIS — J40 Bronchitis, not specified as acute or chronic: Secondary | ICD-10-CM | POA: Diagnosis not present

## 2024-08-20 DIAGNOSIS — J329 Chronic sinusitis, unspecified: Secondary | ICD-10-CM

## 2024-08-20 MED ORDER — IPRATROPIUM-ALBUTEROL 0.5-2.5 (3) MG/3ML IN SOLN
3.0000 mL | Freq: Once | RESPIRATORY_TRACT | Status: AC
  Administered 2024-08-20: 3 mL via RESPIRATORY_TRACT

## 2024-08-20 MED ORDER — AZITHROMYCIN 250 MG PO TABS: ORAL_TABLET | ORAL | 0 refills | Status: AC

## 2024-08-20 MED ORDER — METHYLPREDNISOLONE SODIUM SUCC 125 MG IJ SOLR
125.0000 mg | Freq: Once | INTRAMUSCULAR | Status: AC
  Administered 2024-08-20: 125 mg via INTRAMUSCULAR

## 2024-08-20 MED ORDER — HYDROCOD POLI-CHLORPHE POLI ER 10-8 MG/5ML PO SUER: 5.0000 mL | Freq: Two times a day (BID) | ORAL | 0 refills | Status: AC | PRN

## 2024-08-20 MED ORDER — ALBUTEROL SULFATE HFA 108 (90 BASE) MCG/ACT IN AERS: 2.0000 | INHALATION_SPRAY | Freq: Four times a day (QID) | RESPIRATORY_TRACT | 0 refills | Status: AC | PRN

## 2024-08-20 NOTE — Patient Instructions (Signed)

## 2024-08-20 NOTE — Progress Notes (Signed)
 Acute Office Visit  Subjective:     Patient ID: Johnathan Thompson, male    DOB: [birth year], 48 y.o.   MRN: [id number]  Chief Complaint  Patient presents with   Cough    HPI .SABRADiscussed the use of AI scribe software for clinical note transcription with the patient, who gave verbal consent to proceed.  History of Present Illness Johnathan Thompson is a 48 year old male who presents with respiratory symptoms and congestion for one week. Exposure to wife who was diagnosed with pneumonia this week.   Respiratory congestion and cough - Congestion present for at least one week, described as 'in there' and difficult to clear - Congestion worsens at night - Occasional expectoration of mucus, mostly 'coming up' - No significant shortness of breath except when coughing - No fever - No history of asthma or other pre-existing respiratory conditions - Uncertain history of prior pneumonia  Dyspnea - Difficulty breathing for one week - No significant shortness of breath except during episodes of coughing  Symptomatic management - Using over-the-counter Mucinex and albuterol  inhaler at home - Uncertain effectiveness of these medications  Lifestyle factors - Currently experiencing a particularly busy period with significant travel   ROS See HPI.      Objective:    BP 124/77 (Cuff Size: Normal)   Pulse 91   Temp 98.2 F (36.8 C) (Oral)   SpO2 94%  BP Readings from Last 3 Encounters:  08/20/24 124/77  03/08/24 116/78  12/03/23 116/76   Wt Readings from Last 3 Encounters:  03/08/24 280 lb (127 kg)  01/22/24 280 lb (127 kg)  12/03/23 284 lb 9.6 oz (129.1 kg)    Duoneb given in office today.   Physical Exam Constitutional:      Appearance: Normal appearance.  HENT:     Head: Normocephalic.     Right Ear: Tympanic membrane, ear canal and external ear normal. There is no impacted cerumen.     Left Ear: Tympanic membrane, ear canal and external ear normal. There is no impacted  cerumen.     Nose: Rhinorrhea present.     Mouth/Throat:     Mouth: Mucous membranes are moist.     Pharynx: No oropharyngeal exudate or posterior oropharyngeal erythema.  Eyes:     Conjunctiva/sclera: Conjunctivae normal.  Cardiovascular:     Rate and Rhythm: Normal rate and regular rhythm.  Pulmonary:     Effort: Pulmonary effort is normal.     Breath sounds: Normal breath sounds. No wheezing or rhonchi.     Comments: Productive cough when asked to take deep breaths in and throughout visit.  Musculoskeletal:     Cervical back: Normal range of motion and neck supple.  Neurological:     General: No focal deficit present.     Mental Status: He is alert and oriented to person, place, and time.  Psychiatric:        Mood and Affect: Mood normal.          Assessment & Plan:  SABRASABRACaedan was seen today for cough.  Diagnoses and all orders for this visit:  Sinobronchitis -     azithromycin  (ZITHROMAX  Z-PAK) 250 MG tablet; Take 2 tablets (500 mg) on  Day 1,  followed by 1 tablet (250 mg) once daily on Days 2 through 5. -     chlorpheniramine-HYDROcodone (TUSSIONEX) 10-8 MG/5ML; Take 5 mLs by mouth every 12 (twelve) hours as needed. -     albuterol  (VENTOLIN  HFA) 108 (90 Base)  MCG/ACT inhaler; Inhale 2 puffs into the lungs every 6 (six) hours as needed for wheezing or shortness of breath. -     ipratropium-albuterol  (DUONEB) 0.5-2.5 (3) MG/3ML nebulizer solution 3 mL -     methylPREDNISolone  sodium succinate (SOLU-MEDROL ) 125 mg/2 mL injection 125 mg   Assessment & Plan Sinobronchitis Symptoms persisted for a week with congestion and dyspnea. No fever. Differential includes sinobronchitis vs pneumonia. Oxygen levels decreased at 94 percent.  No significant auscultation findings. Treated with antibiotics due to symptom duration and mucus production. - Administered breathing treatment, Duoneb, with better air movement after.  - Prescribed zpak to start today.  - Tussionex as needed at  bedtime, sedation warning given.  - Solumedrol 125mg  IM given in office today.  - Albuterol  inhaler as needed every 2-6 hours for cough and SOB.    Return if symptoms worsen or fail to improve.  Guliana Weyandt, PA-C

## 2024-08-20 NOTE — Telephone Encounter (Signed)
 FYI Only or Action Required?: FYI only for provider: appointment scheduled on 08/20/24.  Patient was last seen in primary care on 12/31/2023 by Vivienne Delon HERO, PA-C.  Called Nurse Triage reporting Cough and Nasal Congestion.  Symptoms began a week ago.  Interventions attempted: OTC medications: unknown.  Symptoms are: unchanged.  Triage Disposition: See HCP Within 4 Hours (Or PCP Triage)  Patient/caregiver understands and will follow disposition?: Yes  Reason for Disposition  Wheezing is present  Answer Assessment - Initial Assessment Questions Patient states he's had this cough for a little over a week with congestion, headaches, and some wheezing. He reports mild SOB especially with coughing fits. He states he has been traveling frequently for work. He mentions that his wife is having similar symptoms and just got diagnosed with Pneumonia so he wanted to be seen as well. Appt scheduled for 08/20/24.  1. ONSET: When did the cough begin?      Started about a week ago  2. SEVERITY: How bad is the cough today?      Moderate  3. SPUTUM: Describe the color of your sputum (e.g., none, dry cough; clear, white, yellow, green)     NA  4. HEMOPTYSIS: Are you coughing up any blood? If Yes, ask: How much? (e.g., flecks, streaks, tablespoons, etc.)     NA  5. DIFFICULTY BREATHING: Are you having difficulty breathing? If Yes, ask: How bad is it? (e.g., mild, moderate, severe)      Mild, patient states he has been wheezing  6. FEVER: Do you have a fever? If Yes, ask: What is your temperature, how was it measured, and when did it start?     No  7. CARDIAC HISTORY: Do you have any history of heart disease? (e.g., heart attack, congestive heart failure)      No  8. LUNG HISTORY: Do you have any history of lung disease?  (e.g., pulmonary embolus, asthma, emphysema)     No  9. PE RISK FACTORS: Do you have a history of blood clots? (or: recent major surgery,  recent prolonged travel, bedridden)     No  10. OTHER SYMPTOMS: Do you have any other symptoms? (e.g., runny nose, wheezing, chest pain)       Congestion, headaches, wheezing  11. PREGNANCY: Is there any chance you are pregnant? When was your last menstrual period?       NA  12. TRAVEL: Have you traveled out of the country in the last month? (e.g., travel history, exposures)       Patient states he has been traveling frequently for work  Protocols used: Cough - Acute Non-Productive-A-AH  Copied from KEYSPAN #[id number]. Topic: Clinical - Red Word Triage >> Aug 20, 2024 12:47 PM Rosina BIRCH wrote: Red Word that prompted transfer to Nurse Triage: congested, coughing, head hurts, wheezing for over a week
